# Patient Record
Sex: Female | Born: 1980 | Race: Black or African American | Hispanic: No | Marital: Single | Smoking: Never smoker
Health system: Southern US, Community
[De-identification: ages and names within clinical notes are randomized; demographics above are authoritative.]

## PROBLEM LIST (undated history)

## (undated) DIAGNOSIS — E119 Type 2 diabetes mellitus without complications: Secondary | ICD-10-CM

## (undated) DIAGNOSIS — Z789 Other specified health status: Secondary | ICD-10-CM

---

## 1999-05-11 ENCOUNTER — Emergency Department (HOSPITAL_COMMUNITY): Admission: EM | Admit: 1999-05-11 | Discharge: 1999-05-11 | Payer: Self-pay | Admitting: *Deleted

## 2000-12-24 ENCOUNTER — Emergency Department (HOSPITAL_COMMUNITY): Admission: EM | Admit: 2000-12-24 | Discharge: 2000-12-24 | Payer: Self-pay

## 2002-12-07 ENCOUNTER — Emergency Department (HOSPITAL_COMMUNITY): Admission: EM | Admit: 2002-12-07 | Discharge: 2002-12-07 | Payer: Self-pay | Admitting: Emergency Medicine

## 2010-04-21 HISTORY — PX: ELBOW SURGERY: SHX618

## 2014-01-12 ENCOUNTER — Encounter: Payer: Self-pay | Admitting: Certified Nurse Midwife

## 2015-12-21 ENCOUNTER — Other Ambulatory Visit: Payer: Self-pay | Admitting: Orthopedic Surgery

## 2015-12-21 ENCOUNTER — Ambulatory Visit
Admission: RE | Admit: 2015-12-21 | Discharge: 2015-12-21 | Disposition: A | Discharge status: Home / Self Care | Payer: 59 | Source: Ambulatory Visit | Attending: Orthopedic Surgery | Admitting: Orthopedic Surgery

## 2015-12-21 DIAGNOSIS — M47812 Spondylosis without myelopathy or radiculopathy, cervical region: Secondary | ICD-10-CM

## 2017-12-30 ENCOUNTER — Ambulatory Visit: Payer: Self-pay | Admitting: Allergy

## 2018-02-11 ENCOUNTER — Ambulatory Visit: Payer: Self-pay | Admitting: Plastic Surgery

## 2018-02-16 ENCOUNTER — Encounter (HOSPITAL_BASED_OUTPATIENT_CLINIC_OR_DEPARTMENT_OTHER): Payer: Self-pay | Admitting: *Deleted

## 2018-02-16 ENCOUNTER — Other Ambulatory Visit: Payer: Self-pay

## 2018-02-23 ENCOUNTER — Encounter (HOSPITAL_BASED_OUTPATIENT_CLINIC_OR_DEPARTMENT_OTHER)
Admission: RE | Disposition: A | Discharge status: Home / Self Care | Payer: Self-pay | Source: Ambulatory Visit | Attending: Plastic Surgery

## 2018-02-23 ENCOUNTER — Ambulatory Visit (HOSPITAL_BASED_OUTPATIENT_CLINIC_OR_DEPARTMENT_OTHER): Payer: 59 | Admitting: Certified Registered"

## 2018-02-23 ENCOUNTER — Ambulatory Visit (HOSPITAL_BASED_OUTPATIENT_CLINIC_OR_DEPARTMENT_OTHER)
Admission: RE | Admit: 2018-02-23 | Discharge: 2018-02-23 | Disposition: A | Discharge status: Home / Self Care | Payer: 59 | Source: Ambulatory Visit | Attending: Plastic Surgery | Admitting: Plastic Surgery

## 2018-02-23 ENCOUNTER — Other Ambulatory Visit: Payer: Self-pay

## 2018-02-23 ENCOUNTER — Encounter (HOSPITAL_BASED_OUTPATIENT_CLINIC_OR_DEPARTMENT_OTHER): Payer: Self-pay | Admitting: Certified Registered"

## 2018-02-23 DIAGNOSIS — N62 Hypertrophy of breast: Secondary | ICD-10-CM | POA: Insufficient documentation

## 2018-02-23 DIAGNOSIS — Z6841 Body Mass Index (BMI) 40.0 and over, adult: Secondary | ICD-10-CM | POA: Diagnosis not present

## 2018-02-23 HISTORY — DX: Other specified health status: Z78.9

## 2018-02-23 HISTORY — PX: BREAST REDUCTION SURGERY: SHX8

## 2018-02-23 SURGERY — MAMMOPLASTY, REDUCTION
Anesthesia: General | Site: Breast | Laterality: Bilateral

## 2018-02-23 MED ORDER — ROCURONIUM BROMIDE 100 MG/10ML IV SOLN
INTRAVENOUS | Status: DC | PRN
  Administered 2018-02-23: 50 mg via INTRAVENOUS
  Administered 2018-02-23: 20 mg via INTRAVENOUS

## 2018-02-23 MED ORDER — OXYCODONE HCL 5 MG PO TABS
5.0000 mg | ORAL_TABLET | Freq: Once | ORAL | Status: AC | PRN
  Administered 2018-02-23: 5 mg via ORAL

## 2018-02-23 MED ORDER — LACTATED RINGERS IV SOLN
INTRAVENOUS | Status: DC
  Administered 2018-02-23 (×4): via INTRAVENOUS

## 2018-02-23 MED ORDER — ACETAMINOPHEN 325 MG PO TABS
325.0000 mg | ORAL_TABLET | ORAL | Status: DC | PRN
  Administered 2018-02-23: 650 mg via ORAL

## 2018-02-23 MED ORDER — ACETAMINOPHEN 160 MG/5ML PO SOLN: 325.0000 mg | ORAL | Status: DC | PRN

## 2018-02-23 MED ORDER — PROPOFOL 10 MG/ML IV BOLUS
INTRAVENOUS | Status: AC
  Filled 2018-02-23: qty 20

## 2018-02-23 MED ORDER — SUGAMMADEX SODIUM 500 MG/5ML IV SOLN
INTRAVENOUS | Status: AC
  Filled 2018-02-23: qty 5

## 2018-02-23 MED ORDER — ONDANSETRON HCL 4 MG/2ML IJ SOLN
INTRAMUSCULAR | Status: DC | PRN
  Administered 2018-02-23: 4 mg via INTRAVENOUS

## 2018-02-23 MED ORDER — CEFAZOLIN SODIUM-DEXTROSE 2-4 GM/100ML-% IV SOLN
INTRAVENOUS | Status: AC
  Filled 2018-02-23: qty 100

## 2018-02-23 MED ORDER — FENTANYL CITRATE (PF) 100 MCG/2ML IJ SOLN
INTRAMUSCULAR | Status: AC
  Filled 2018-02-23: qty 2

## 2018-02-23 MED ORDER — LIDOCAINE 2% (20 MG/ML) 5 ML SYRINGE
INTRAMUSCULAR | Status: AC
  Filled 2018-02-23: qty 10

## 2018-02-23 MED ORDER — ONDANSETRON HCL 4 MG/2ML IJ SOLN
INTRAMUSCULAR | Status: AC
  Filled 2018-02-23: qty 4

## 2018-02-23 MED ORDER — SODIUM CHLORIDE 0.9 % IJ SOLN
INTRAMUSCULAR | Status: AC
  Filled 2018-02-23: qty 50

## 2018-02-23 MED ORDER — SCOPOLAMINE 1 MG/3DAYS TD PT72: 1.0000 | MEDICATED_PATCH | Freq: Once | TRANSDERMAL | Status: DC | PRN

## 2018-02-23 MED ORDER — LIDOCAINE HCL (CARDIAC) PF 100 MG/5ML IV SOSY
PREFILLED_SYRINGE | INTRAVENOUS | Status: DC | PRN
  Administered 2018-02-23: 60 mg via INTRAVENOUS

## 2018-02-23 MED ORDER — SODIUM CHLORIDE 0.9 % IJ SOLN
INTRAMUSCULAR | Status: DC | PRN
  Administered 2018-02-23: 60 mL

## 2018-02-23 MED ORDER — KETOROLAC TROMETHAMINE 30 MG/ML IJ SOLN
30.0000 mg | Freq: Once | INTRAMUSCULAR | Status: AC | PRN
  Administered 2018-02-23: 30 mg via INTRAVENOUS

## 2018-02-23 MED ORDER — BUPIVACAINE LIPOSOME 1.3 % IJ SUSP
INTRAMUSCULAR | Status: DC | PRN
  Administered 2018-02-23: 50 mL

## 2018-02-23 MED ORDER — PROMETHAZINE HCL 25 MG/ML IJ SOLN: 6.2500 mg | INTRAMUSCULAR | Status: DC | PRN

## 2018-02-23 MED ORDER — SUGAMMADEX SODIUM 500 MG/5ML IV SOLN
INTRAVENOUS | Status: DC | PRN
  Administered 2018-02-23: 200 mg via INTRAVENOUS

## 2018-02-23 MED ORDER — SODIUM CHLORIDE 0.9 % IJ SOLN
INTRAMUSCULAR | Status: DC | PRN
  Administered 2018-02-23: 70 mL

## 2018-02-23 MED ORDER — PROPOFOL 10 MG/ML IV BOLUS
INTRAVENOUS | Status: DC | PRN
  Administered 2018-02-23: 200 mg via INTRAVENOUS

## 2018-02-23 MED ORDER — CEFAZOLIN SODIUM-DEXTROSE 2-4 GM/100ML-% IV SOLN
2.0000 g | INTRAVENOUS | Status: AC
  Administered 2018-02-23: 2 g via INTRAVENOUS

## 2018-02-23 MED ORDER — SODIUM CHLORIDE 0.9 % IJ SOLN
INTRAMUSCULAR | Status: AC
  Filled 2018-02-23: qty 20

## 2018-02-23 MED ORDER — MIDAZOLAM HCL 2 MG/2ML IJ SOLN
INTRAMUSCULAR | Status: AC
  Filled 2018-02-23: qty 2

## 2018-02-23 MED ORDER — LIDOCAINE-EPINEPHRINE 1 %-1:100000 IJ SOLN
INTRAMUSCULAR | Status: AC
  Filled 2018-02-23: qty 1

## 2018-02-23 MED ORDER — FENTANYL CITRATE (PF) 100 MCG/2ML IJ SOLN
25.0000 ug | INTRAMUSCULAR | Status: DC | PRN
  Administered 2018-02-23: 50 ug via INTRAVENOUS
  Administered 2018-02-23 (×2): 25 ug via INTRAVENOUS

## 2018-02-23 MED ORDER — ACETAMINOPHEN 325 MG PO TABS
ORAL_TABLET | ORAL | Status: AC
  Filled 2018-02-23: qty 2

## 2018-02-23 MED ORDER — OXYCODONE HCL 5 MG PO TABS
ORAL_TABLET | ORAL | Status: AC
  Filled 2018-02-23: qty 1

## 2018-02-23 MED ORDER — DEXAMETHASONE SODIUM PHOSPHATE 4 MG/ML IJ SOLN
INTRAMUSCULAR | Status: DC | PRN
  Administered 2018-02-23: 10 mg via INTRAVENOUS

## 2018-02-23 MED ORDER — KETOROLAC TROMETHAMINE 30 MG/ML IJ SOLN
INTRAMUSCULAR | Status: AC
  Filled 2018-02-23: qty 1

## 2018-02-23 MED ORDER — PROPOFOL 500 MG/50ML IV EMUL
INTRAVENOUS | Status: AC
  Filled 2018-02-23: qty 100

## 2018-02-23 MED ORDER — BUPIVACAINE HCL (PF) 0.5 % IJ SOLN
INTRAMUSCULAR | Status: AC
  Filled 2018-02-23: qty 30

## 2018-02-23 MED ORDER — BUPIVACAINE HCL (PF) 0.25 % IJ SOLN
INTRAMUSCULAR | Status: AC
  Filled 2018-02-23: qty 30

## 2018-02-23 MED ORDER — CHLORHEXIDINE GLUCONATE CLOTH 2 % EX PADS: 6.0000 | MEDICATED_PAD | Freq: Once | CUTANEOUS | Status: DC

## 2018-02-23 MED ORDER — MEPERIDINE HCL 25 MG/ML IJ SOLN: 6.2500 mg | INTRAMUSCULAR | Status: DC | PRN

## 2018-02-23 MED ORDER — DEXAMETHASONE SODIUM PHOSPHATE 10 MG/ML IJ SOLN
INTRAMUSCULAR | Status: AC
  Filled 2018-02-23: qty 1

## 2018-02-23 MED ORDER — BACITRACIN ZINC 500 UNIT/GM EX OINT
TOPICAL_OINTMENT | CUTANEOUS | Status: AC
  Filled 2018-02-23: qty 28.35

## 2018-02-23 MED ORDER — OXYCODONE HCL 5 MG/5ML PO SOLN: 5.0000 mg | Freq: Once | ORAL | Status: AC | PRN

## 2018-02-23 MED ORDER — FENTANYL CITRATE (PF) 100 MCG/2ML IJ SOLN
50.0000 ug | INTRAMUSCULAR | Status: AC | PRN
  Administered 2018-02-23: 100 ug via INTRAVENOUS
  Administered 2018-02-23 (×4): 50 ug via INTRAVENOUS
  Administered 2018-02-23: 100 ug via INTRAVENOUS

## 2018-02-23 MED ORDER — PROPOFOL 500 MG/50ML IV EMUL
INTRAVENOUS | Status: DC | PRN
  Administered 2018-02-23: 35 ug/kg/min via INTRAVENOUS

## 2018-02-23 MED ORDER — BACITRACIN ZINC 500 UNIT/GM EX OINT
TOPICAL_OINTMENT | CUTANEOUS | Status: DC | PRN
  Administered 2018-02-23: 1 via TOPICAL

## 2018-02-23 MED ORDER — MIDAZOLAM HCL 2 MG/2ML IJ SOLN
1.0000 mg | INTRAMUSCULAR | Status: DC | PRN
  Administered 2018-02-23: 2 mg via INTRAVENOUS

## 2018-02-23 MED ORDER — BUPIVACAINE-EPINEPHRINE (PF) 0.25% -1:200000 IJ SOLN
INTRAMUSCULAR | Status: AC
  Filled 2018-02-23: qty 30

## 2018-02-23 MED ORDER — EPHEDRINE SULFATE 50 MG/ML IJ SOLN
INTRAMUSCULAR | Status: DC | PRN
  Administered 2018-02-23: 10 mg via INTRAVENOUS

## 2018-02-23 MED ORDER — ROCURONIUM BROMIDE 50 MG/5ML IV SOSY
PREFILLED_SYRINGE | INTRAVENOUS | Status: AC
  Filled 2018-02-23: qty 5

## 2018-02-23 MED ORDER — BUPIVACAINE LIPOSOME 1.3 % IJ SUSP
INTRAMUSCULAR | Status: AC
  Filled 2018-02-23: qty 20

## 2018-02-23 SURGICAL SUPPLY — 57 items
BAG DECANTER FOR FLEXI CONT (MISCELLANEOUS) ×3 IMPLANT
BENZOIN TINCTURE PRP APPL 2/3 (GAUZE/BANDAGES/DRESSINGS) ×6 IMPLANT
BLADE KNIFE PERSONA 10 (BLADE) ×12 IMPLANT
BLADE KNIFE PERSONA 15 (BLADE) ×9 IMPLANT
BNDG GAUZE ELAST 4 BULKY (GAUZE/BANDAGES/DRESSINGS) ×6 IMPLANT
CANISTER SUCT 1200ML W/VALVE (MISCELLANEOUS) ×3 IMPLANT
CLOSURE WOUND 1/2 X4 (GAUZE/BANDAGES/DRESSINGS) ×4
COVER BACK TABLE 60X90IN (DRAPES) ×3 IMPLANT
COVER MAYO STAND STRL (DRAPES) ×3 IMPLANT
DECANTER SPIKE VIAL GLASS SM (MISCELLANEOUS) ×6 IMPLANT
DRAIN CHANNEL 10F 3/8 F FF (DRAIN) ×6 IMPLANT
DRAPE U-SHAPE 76X120 STRL (DRAPES) ×4 IMPLANT
DRSG EMULSION OIL 3X3 NADH (GAUZE/BANDAGES/DRESSINGS) ×6 IMPLANT
DRSG PAD ABDOMINAL 8X10 ST (GAUZE/BANDAGES/DRESSINGS) ×6 IMPLANT
ELECT REM PT RETURN 9FT ADLT (ELECTROSURGICAL) ×3
ELECTRODE REM PT RTRN 9FT ADLT (ELECTROSURGICAL) ×1 IMPLANT
EVACUATOR SILICONE 100CC (DRAIN) ×6 IMPLANT
GAUZE SPONGE 4X4 12PLY STRL (GAUZE/BANDAGES/DRESSINGS) ×6 IMPLANT
GLOVE BIO SURGEON STRL SZ 6.5 (GLOVE) ×1 IMPLANT
GLOVE BIO SURGEON STRL SZ7 (GLOVE) ×3 IMPLANT
GLOVE BIO SURGEONS STRL SZ 6.5 (GLOVE) ×1
GLOVE BIOGEL PI IND STRL 7.0 (GLOVE) IMPLANT
GLOVE BIOGEL PI INDICATOR 7.0 (GLOVE) ×2
GOWN STRL REUS W/ TWL LRG LVL3 (GOWN DISPOSABLE) ×2 IMPLANT
GOWN STRL REUS W/TWL LRG LVL3 (GOWN DISPOSABLE) ×4
NDL HYPO 25X1 1.5 SAFETY (NEEDLE) ×3 IMPLANT
NDL SAFETY ECLIPSE 18X1.5 (NEEDLE) ×1 IMPLANT
NDL SPNL 18GX3.5 QUINCKE PK (NEEDLE) ×1 IMPLANT
NEEDLE HYPO 18GX1.5 SHARP (NEEDLE) ×2
NEEDLE HYPO 25X1 1.5 SAFETY (NEEDLE) ×3 IMPLANT
NEEDLE SPNL 18GX3.5 QUINCKE PK (NEEDLE) ×3 IMPLANT
NS IRRIG 1000ML POUR BTL (IV SOLUTION) ×6 IMPLANT
PACK BASIN DAY SURGERY FS (CUSTOM PROCEDURE TRAY) ×3 IMPLANT
PIN SAFETY STERILE (MISCELLANEOUS) ×3 IMPLANT
SCRUB TECHNI CARE 4 OZ NO DYE (MISCELLANEOUS) ×3 IMPLANT
SLEEVE SCD COMPRESS KNEE MED (MISCELLANEOUS) ×3 IMPLANT
SPONGE LAP 18X18 RF (DISPOSABLE) ×11 IMPLANT
STAPLER VISISTAT 35W (STAPLE) ×3 IMPLANT
STRIP CLOSURE SKIN 1/2X4 (GAUZE/BANDAGES/DRESSINGS) ×8 IMPLANT
SUT ETHILON 3 0 PS 1 (SUTURE) ×3 IMPLANT
SUT MNCRL AB 3-0 PS2 18 (SUTURE) ×12 IMPLANT
SUT MNCRL AB 4-0 PS2 18 (SUTURE) ×6 IMPLANT
SUT MON AB 5-0 PS2 18 (SUTURE) ×6 IMPLANT
SUT PROLENE 2 0 CT2 30 (SUTURE) ×3 IMPLANT
SUT PROLENE 3 0 PS 1 (SUTURE) ×6 IMPLANT
SUT VLOC 90 P-14 23 (SUTURE) ×6 IMPLANT
SYR BULB IRRIGATION 50ML (SYRINGE) ×6 IMPLANT
SYR CONTROL 10ML LL (SYRINGE) ×6 IMPLANT
TAPE MEASURE VINYL STERILE (MISCELLANEOUS) ×3 IMPLANT
TOWEL GREEN STERILE FF (TOWEL DISPOSABLE) ×9 IMPLANT
TRAY DSU PREP LF (CUSTOM PROCEDURE TRAY) ×3 IMPLANT
TRAY FOLEY W/BAG SLVR 14FR LF (SET/KITS/TRAYS/PACK) ×2 IMPLANT
TUBE CONNECTING 20'X1/4 (TUBING) ×1
TUBE CONNECTING 20X1/4 (TUBING) ×2 IMPLANT
UNDERPAD 30X30 (UNDERPADS AND DIAPERS) ×6 IMPLANT
VAC PENCILS W/TUBING CLEAR (MISCELLANEOUS) ×3 IMPLANT
YANKAUER SUCT BULB TIP NO VENT (SUCTIONS) ×3 IMPLANT

## 2018-02-23 NOTE — Discharge Instructions (Signed)
1. No lifting greater than 5 lbs with arms for 4 weeks. 2. Empty, strip, record and reactivate JP drains 3 times a day. 3. Percocet 5/325 mg tabs 1-2 tabs po q 4-6 hours prn pain- prescription given in office. 4. Duricef 1 tab po bid- prescription given in office. 5. Sterapred dose pack as directed- prescription given in office. 6. Follow-up appointment Friday in office.     About my Jackson-Pratt Bulb Drain  What is a Jackson-Pratt bulb? A Jackson-Pratt is a soft, round device used to collect drainage. It is connected to a long, thin drainage catheter, which is held in place by one or two small stiches near your surgical incision site. When the bulb is squeezed, it forms a vacuum, forcing the drainage to empty into the bulb.  Emptying the Jackson-Pratt bulb- To empty the bulb: 1. Release the plug on the top of the bulb. 2. Pour the bulb's contents into a measuring container which your nurse will provide. 3. Record the time emptied and amount of drainage. Empty the drain(s) as often as your     doctor or nurse recommends.  Date                  Time                    Amount (Drain 1)                 Amount (Drain 2)  _____________________________________________________________________  _____________________________________________________________________  _____________________________________________________________________  _____________________________________________________________________  _____________________________________________________________________  _____________________________________________________________________  _____________________________________________________________________  _____________________________________________________________________  Squeezing the Jackson-Pratt Bulb- To squeeze the bulb: 1. Make sure the plug at the top of the bulb is open. 2. Squeeze the bulb tightly in your fist. You will hear air squeezing from the bulb. 3.  Replace the plug while the bulb is squeezed. 4. Use a safety pin to attach the bulb to your clothing. This will keep the catheter from     pulling at the bulb insertion site.  When to call your doctor- Call your doctor if:  Drain site becomes red, swollen or hot.  You have a fever greater than 101 degrees F.  There is oozing at the drain site.  Drain falls out (apply a guaze bandage over the drain hole and secure it with tape).  Drainage increases daily not related to activity patterns. (You will usually have more drainage when you are active than when you are resting.)  Drainage has a bad odor.      Post Anesthesia Home Care Instructions  Activity: Get plenty of rest for the remainder of the day. A responsible individual must stay with you for 24 hours following the procedure.  For the next 24 hours, DO NOT: -Drive a car -Advertising copywriter -Drink alcoholic beverages -Take any medication unless instructed by your physician -Make any legal decisions or sign important papers.  Meals: Start with liquid foods such as gelatin or soup. Progress to regular foods as tolerated. Avoid greasy, spicy, heavy foods. If nausea and/or vomiting occur, drink only clear liquids until the nausea and/or vomiting subsides. Call your physician if vomiting continues.  Special Instructions/Symptoms: Your throat may feel dry or sore from the anesthesia or the breathing tube placed in your throat during surgery. If this causes discomfort, gargle with warm salt water. The discomfort should disappear within 24 hours.  If you had a scopolamine patch placed behind your ear for the management of post- operative nausea and/or vomiting:  1. The  medication in the patch is effective for 72 hours, after which it should be removed.  Wrap patch in a tissue and discard in the trash. Wash hands thoroughly with soap and water. 2. You may remove the patch earlier than 72 hours if you experience unpleasant side  effects which may include dry mouth, dizziness or visual disturbances. 3. Avoid touching the patch. Wash your hands with soap and water after contact with the patch.     Information for Discharge Teaching: EXPAREL (bupivacaine liposome injectable suspension)   Your surgeon or anesthesiologist gave you EXPAREL(bupivacaine) to help control your pain after surgery.   EXPAREL is a local anesthetic that provides pain relief by numbing the tissue around the surgical site.  EXPAREL is designed to release pain medication over time and can control pain for up to 72 hours.  Depending on how you respond to EXPAREL, you may require less pain medication during your recovery.  Possible side effects:  Temporary loss of sensation or ability to move in the area where bupivacaine was injected.  Nausea, vomiting, constipation  Rarely, numbness and tingling in your mouth or lips, lightheadedness, or anxiety may occur.  Call your doctor right away if you think you may be experiencing any of these sensations, or if you have other questions regarding possible side effects.  Follow all other discharge instructions given to you by your surgeon or nurse. Eat a healthy diet and drink plenty of water or other fluids.  If you return to the hospital for any reason within 96 hours following the administration of EXPAREL, it is important for health care providers to know that you have received this anesthetic. A teal colored band has been placed on your arm with the date, time and amount of EXPAREL you have received in order to alert and inform your health care providers. Please leave this armband in place for the full 96 hours following administration, and then you may remove the band.

## 2018-02-23 NOTE — Anesthesia Preprocedure Evaluation (Addendum)
Anesthesia Evaluation  Patient identified by MRN, date of birth, ID band Patient awake    Reviewed: Allergy & Precautions, NPO status , Patient's Chart, lab work & pertinent test results  Airway Mallampati: I       Dental no notable dental hx. (+) Teeth Intact   Pulmonary neg pulmonary ROS,    Pulmonary exam normal breath sounds clear to auscultation       Cardiovascular negative cardio ROS Normal cardiovascular exam Rhythm:Regular Rate:Normal     Neuro/Psych negative neurological ROS  negative psych ROS   GI/Hepatic negative GI ROS, Neg liver ROS,   Endo/Other  negative endocrine ROSMorbid obesity  Renal/GU negative Renal ROS  negative genitourinary   Musculoskeletal negative musculoskeletal ROS (+)   Abdominal (+) + obese,   Peds  Hematology negative hematology ROS (+)   Anesthesia Other Findings Day of surgery medications reviewed with the patient.  Reproductive/Obstetrics                            Anesthesia Physical Anesthesia Plan  ASA: III  Anesthesia Plan: General   Post-op Pain Management:    Induction:   PONV Risk Score and Plan: 3 and Treatment may vary due to age or medical condition, Ondansetron, Dexamethasone, Midazolam and Scopolamine patch - Pre-op  Airway Management Planned: Oral ETT  Additional Equipment:   Intra-op Plan:   Post-operative Plan: Extubation in OR  Informed Consent: I have reviewed the patients History and Physical, chart, labs and discussed the procedure including the risks, benefits and alternatives for the proposed anesthesia with the patient or authorized representative who has indicated his/her understanding and acceptance.   Dental advisory given  Plan Discussed with:   Anesthesia Plan Comments:        Anesthesia Quick Evaluation

## 2018-02-23 NOTE — Op Note (Signed)
OPERATIVE REPORT  02/23/2018  Leslie West  PREOPERATIVE DIAGNOSIS:  Bilateral macromastia.  POSTOPERATIVE DIAGNOSIS:  Bilateral macromastia.  PROCEDURE:  Bilateral reduction mammoplasties.  ATTENDING SURGEON:  Eloise Levels, MD  ANESTHESIA:  General.  ANESTHESIOLOGIST: Leilani Able , MD  COMPLICATIONS:  None.  INDICATIONS FOR THE PROCEDURE:  The patient is a 38 y.o. female who has bilateral macromastia that is clinically symptomatic.  She presents to undergo bilateral reduction mammoplasties.  DESCRIPTION OF PROCEDURE:  The patient was marked in preop holding area in a pattern of Wise for the future bilateral reduction mammoplasties. She was then taken back to the OR, placed on the table in supine position.  After adequate general anesthesia was obtained, the patient's chest was prepped with Techni-Care and draped in sterile fashion.  The bases of the breasts have been infiltrated with 1% lidocaine with epinephrine.  After adequate hemostasis and anesthesia taken effect, the procedure was begun.  Both of the breast reductions were performed in the following similar manner.  The nipple-areolar complex was marked with a 45-mm nipple marker.  The skin was then incised and deepithelialized around the nipple-areolar complex down to the inframammary crease in the inferior pedicle pattern.  Next, the medial, superior, and lateral skin flaps were elevated down to the chest wall.  Excess fat and glandular tissue removed from the inferior pedicle.  The nipple-areolar complex was examined and found to be pink and viable.  The wound was irrigated with saline irrigation.  Meticulous hemostasis was obtained with the Bovie electrocautery.  Inferior pedicle was centralized using 3-0 Prolene suture.  A #10 JP flat fully fluted drain was placed into the wound. The skin flaps were brought together at the inverted T junction with a 2- 0 Prolene suture.  The incisions were  stapled for temporary closure. The breasts compared and found to have good shape and symmetry.  The incisions were then closed from the medial aspect of the JP drain to the medial aspect of the [facility] incision by first placing a few 3-0 Monocryl sutures to tack together the dermal layer, and then both the dermal and cuticular layer were closed in a single layer using a 2-0 V-Lock barbed suture.  Lateral to the JP drain incision was closed using 3-0 Monocryl in the dermal layer, followed by 3-0 Monocryl running intracuticular stitch on the skin.  The vertical limb of the Wise pattern was closed in the dermal layer using 3-0 Monocryl suture.  The patient was placed in the upright position.  The future location of the nipple-areolar complexes was marked on both breast mounds using the 45-mm nipple marker.  She was then placed back in the recumbent position.  Both of the nipple areolar complexes were brought out onto the breast mounds in the following similar manner.  The skin was incised as marked and removed in full thickness into the subcutaneous tissues.  The nipple- areolar complex was examined, found to be pink and viable, then brought out through this aperture and sewn in place using 4-0 Monocryl in the dermal layer, followed by 5-0 Monocryl running intracuticular stitch on the skin.  This 5-0 Monocryl suture was then brought down to close the cuticular layer of the vertical limb as well.  The JP drain was sewn in place using 3-0 nylon suture.  The pectoralis major muscle and fascia along with the breast and chest soft tissues were then infiltrated with 1% Exparel (total 266 mg).  Now the [facility] incision was also infiltrated with  the Exparel in order to give the patient postoperative pain control.  The incisions were dressed with benzoin, Steri-Strips, and the nipples dressed with bacitracin ointment and Adaptic.  4x4s were placed over the incisions and ABD pads in the axillary areas.  The  patient was placed into a light postoperative support bra.  There were no complications. The patient tolerated the procedure well.  The final needle, sponge counts were reported to be correct at the end of the case.  The patient was then recovered without complications.  Both the patient and her family were given proper postoperative wound care instructions. She was then discharged home in the care of her family in stable condition.  Follow up will be with me in a few days in the office.         Eloise Levels, M.D.  02/23/2018 1:03 PM

## 2018-02-23 NOTE — Brief Op Note (Signed)
02/23/2018  1:01 PM  PATIENT:  Leslie West  37 y.o. female  PRE-OPERATIVE DIAGNOSIS:  BILATERAL MACROMASTIA  POST-OPERATIVE DIAGNOSIS:  BILATERAL MACROMASTIA  PROCEDURE:  Procedure(s): MAMMARY REDUCTION  (BREAST) (Bilateral)  SURGEON:  Surgeon(s) and Role:    * Contogiannis, Chales Abrahams, MD - Primary  ANESTHESIA:   general  EBL:  100 mL   BLOOD ADMINISTERED:none  DRAINS: (37F) Jackson-Pratt drain(s) with closed bulb suction in the Bilateral Breasts   LOCAL MEDICATIONS USED:  1.3% Exparel (total 266 mgs.)  SPECIMEN:  Source of Specimen:  Bilateral Breasts  DISPOSITION OF SPECIMEN:  PATHOLOGY  COUNTS:  YES  DICTATION: .Note written in EPIC  PLAN OF CARE: Discharge to home after PACU  PATIENT DISPOSITION:  PACU - hemodynamically stable.   Delay start of Pharmacological VTE agent (>24hrs) due to surgical blood loss or risk of bleeding: not applicable

## 2018-02-23 NOTE — H&P (Signed)
H&P faxed to surgical center.  -History and Physical Reviewed  -Patient has been re-examined  -No change in the plan of care  , A   H&P faxed to surgical center.  -History and Physical Reviewed  -Patient has been re-examined  -No change in the plan of care  , A

## 2018-02-23 NOTE — Anesthesia Procedure Notes (Signed)
Procedure Name: Intubation Date/Time: 02/23/2018 7:55 AM Performed by: Signe Colt, CRNA Pre-anesthesia Checklist: Patient identified, Emergency Drugs available, Suction available and Patient being monitored Patient Re-evaluated:Patient Re-evaluated prior to induction Oxygen Delivery Method: Circle system utilized Preoxygenation: Pre-oxygenation with 100% oxygen Induction Type: IV induction Ventilation: Mask ventilation without difficulty Laryngoscope Size: Mac and 3 Grade View: Grade II Tube type: Oral Tube size: 7.0 mm Number of attempts: 1 Airway Equipment and Method: Stylet and Oral airway Placement Confirmation: ETT inserted through vocal cords under direct vision,  positive ETCO2 and breath sounds checked- equal and bilateral Secured at: 21 cm Tube secured with: Tape Dental Injury: Teeth and Oropharynx as per pre-operative assessment

## 2018-02-23 NOTE — Anesthesia Postprocedure Evaluation (Signed)
Anesthesia Post Note  Patient: Leslie West  Procedure(s) Performed: MAMMARY REDUCTION  (BREAST) (Bilateral Breast)     Patient location during evaluation: PACU Anesthesia Type: General Level of consciousness: awake and alert Pain management: pain level controlled Vital Signs Assessment: post-procedure vital signs reviewed and stable Respiratory status: spontaneous breathing, nonlabored ventilation and respiratory function stable Cardiovascular status: blood pressure returned to baseline and stable Postop Assessment: no apparent nausea or vomiting Anesthetic complications: no Comments: HR 110s, consistent with preoperative HR.     Last Vitals:  Vitals:   02/23/18 1400 02/23/18 1415  BP: (!) 143/70 120/66  Pulse: (!) 116 (!) 118  Resp: (!) 24 (!) 24  Temp:    SpO2: 100% 98%    Last Pain:  Vitals:   02/23/18 1400  TempSrc:   PainSc: 5                  Kaylyn Layer

## 2018-02-23 NOTE — Transfer of Care (Signed)
Immediate Anesthesia Transfer of Care Note  Patient: Leslie West  Procedure(s) Performed: MAMMARY REDUCTION  (BREAST) (Bilateral Breast)  Patient Location: PACU  Anesthesia Type:General  Level of Consciousness: drowsy and patient cooperative  Airway & Oxygen Therapy: Patient Spontanous Breathing and Patient connected to face mask oxygen  Post-op Assessment: Report given to RN and Post -op Vital signs reviewed and stable  Post vital signs: Reviewed and stable  Last Vitals:  Vitals Value Taken Time  BP 140/70 02/23/2018  1:22 PM  Temp    Pulse 127 02/23/2018  1:25 PM  Resp 25 02/23/2018  1:25 PM  SpO2 100 % 02/23/2018  1:25 PM  Vitals shown include unvalidated device data.  Last Pain:  Vitals:   02/23/18 0654  TempSrc: Oral  PainSc: 0-No pain      Patients Stated Pain Goal: 4 (02/23/18 0654)  Complications: No apparent anesthesia complications

## 2018-02-24 ENCOUNTER — Encounter (HOSPITAL_BASED_OUTPATIENT_CLINIC_OR_DEPARTMENT_OTHER): Payer: Self-pay | Admitting: Plastic Surgery

## 2018-07-05 ENCOUNTER — Emergency Department (HOSPITAL_COMMUNITY): Payer: 59

## 2018-07-05 ENCOUNTER — Emergency Department (HOSPITAL_COMMUNITY)
Admission: EM | Admit: 2018-07-05 | Discharge: 2018-07-05 | Disposition: A | Discharge status: Home / Self Care | Payer: 59 | Attending: Emergency Medicine | Admitting: Emergency Medicine

## 2018-07-05 ENCOUNTER — Other Ambulatory Visit: Payer: Self-pay

## 2018-07-05 ENCOUNTER — Encounter (HOSPITAL_COMMUNITY): Payer: Self-pay | Admitting: Emergency Medicine

## 2018-07-05 DIAGNOSIS — R030 Elevated blood-pressure reading, without diagnosis of hypertension: Secondary | ICD-10-CM | POA: Diagnosis not present

## 2018-07-05 DIAGNOSIS — J029 Acute pharyngitis, unspecified: Secondary | ICD-10-CM | POA: Diagnosis present

## 2018-07-05 DIAGNOSIS — Z79899 Other long term (current) drug therapy: Secondary | ICD-10-CM | POA: Diagnosis not present

## 2018-07-05 DIAGNOSIS — J00 Acute nasopharyngitis [common cold]: Secondary | ICD-10-CM

## 2018-07-05 LAB — POC URINE PREG, ED: PREG TEST UR: NEGATIVE

## 2018-07-05 MED ORDER — BENZONATATE 100 MG PO CAPS: 200.0000 mg | ORAL_CAPSULE | Freq: Three times a day (TID) | ORAL | 0 refills | Status: DC | PRN

## 2018-07-05 MED ORDER — HYDROCOD POLST-CPM POLST ER 10-8 MG/5ML PO SUER: 5.0000 mL | Freq: Two times a day (BID) | ORAL | 0 refills | Status: DC

## 2018-07-05 MED ORDER — BENZONATATE 100 MG PO CAPS
200.0000 mg | ORAL_CAPSULE | Freq: Once | ORAL | Status: AC
  Administered 2018-07-05: 200 mg via ORAL
  Filled 2018-07-05: qty 2

## 2018-07-05 NOTE — Discharge Instructions (Addendum)
Rest to make sure you are drinking plenty of fluids.  Rest your voice so this can heal as well.  Continue taking your home medications, you may add the Tessalon for additional cough relief.  This will not make you drowsy.  Also Tussionex is a narcotic cough syrup which can also greatly improve your cough and should help you sleep as this medication will make you drowsy.  Having said that, do not drive within 4 hours of taking this medication.  Get rechecked for any worsened or persistent symptoms.  Your chest x-ray is clear today.  Also, your blood pressure has been elevated during todays exam and you will benefit from establishing care with a primary doctor for further evaluation and management of this- several suggestions for this are listed.

## 2018-07-05 NOTE — ED Notes (Signed)
poc preg resulted negative

## 2018-07-05 NOTE — ED Triage Notes (Signed)
Patient reports chills on Tuesday night but none since, has had a sore throat. Patient reports cough that is worse at night.

## 2018-07-05 NOTE — ED Provider Notes (Signed)
[facility] EMERGENCY DEPARTMENT Provider Note   CSN: [id number] Arrival date & time: 07/05/18  1656    History   Chief Complaint Chief Complaint  Patient presents with  . Sore Throat    HPI Leslie West is a 38 y.o. female presented with a 2-week history of URI type symptoms which started as nasal congestion with yellow thick nasal discharge along with copious postnasal drip.  Her congestion symptoms have improved but she has developed sore throat along with hoarseness of voice along with a dry tickle in her throat causing significant nonproductive cough and choking to the point of near emesis.  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  She was unable to perform her job secondary to frequency of coughing and gagging.  She has used Equate brand OTC medications including an equate brand of allergy decongestant along with Flonase nasal spray.  She endorses having worsened allergies since returning here from living in [place].  She denies fevers or chills, although states she may have had fever when her symptoms first started.  She denies nausea or abdominal pain.  Also denies headache, ear pain, dizziness.     The history is provided by the patient.    Past Medical History:  Diagnosis Date  . Medical history non-contributory     There are no active problems to display for this patient.   Past Surgical History:  Procedure Laterality Date  . BREAST REDUCTION SURGERY Bilateral 02/23/2018   Procedure: MAMMARY REDUCTION  (BREAST);  Surgeon: Contogiannis, Chales Abrahams, MD;  Location: [facility];  Service: Plastics;  Laterality: Bilateral;  . ELBOW SURGERY  2012     OB History   No obstetric history on file.      Home Medications    Prior to Admission medications   Medication Sig Start Date End Date Taking? Authorizing Provider   Pseudoeph-Doxylamine-DM-APAP (DAYQUIL/NYQUIL COLD/FLU RELIEF PO) Take 30 mLs by mouth every 4 (four) hours as needed (cold/flu symptoms).   Yes [provider]  benzonatate (TESSALON) 100 MG capsule Take 2 capsules (200 mg total) by mouth 3 (three) times daily as needed. 07/05/18   Burgess Amor, PA-C  chlorpheniramine-HYDROcodone (TUSSIONEX PENNKINETIC ER) 10-8 MG/5ML SUER Take 5 mLs by mouth 2 (two) times daily. 07/05/18   Burgess Amor, PA-C    Family History Family History  Problem Relation Age of Onset  . Cancer Mother     Social History Social History   Tobacco Use  . Smoking status: Never Smoker  . Smokeless tobacco: Never Used  Substance Use Topics  . Alcohol use: Yes    Frequency: Never    Comment: occas  . Drug use: Never     Allergies   Nickel   Review of Systems Review of Systems  Constitutional: Positive for fever. Negative for chills.  HENT: Positive for congestion, postnasal drip, rhinorrhea and sore throat. Negative for ear pain, sinus pressure, sinus pain, trouble swallowing and voice change.   Eyes: Negative for discharge.  Respiratory: Positive for cough and choking. Negative for shortness of breath, wheezing and stridor.   Cardiovascular: Negative for chest pain.  Gastrointestinal: Negative for abdominal pain and vomiting.  Genitourinary: Negative.      Physical Exam Updated Vital Signs BP (!) 161/106 (BP Location: Left Arm)   Pulse 96   Temp 98.9 F (37.2 C) (Oral)   Ht  5\' 3"  (1.6 m)   Wt 105.2 kg   LMP 06/21/2018   SpO2 98%   BMI 41.10 kg/m   Physical Exam Vitals signs and nursing note reviewed.  Constitutional:      Appearance: She is well-developed.  HENT:     Head: Normocephalic and atraumatic.     Right Ear: Tympanic membrane and ear canal normal.     Left Ear: Tympanic membrane and ear canal normal.     Nose: Mucosal edema and rhinorrhea present.     Mouth/Throat:     Pharynx: Uvula midline. No oropharyngeal exudate or  posterior oropharyngeal erythema.     Tonsils: No tonsillar abscesses.  Eyes:     Conjunctiva/sclera: Conjunctivae normal.  Neck:     Musculoskeletal: Normal range of motion.     Comments: Slight hoarseness to voice. Cardiovascular:     Rate and Rhythm: Normal rate.     Heart sounds: Normal heart sounds.  Pulmonary:     Effort: Pulmonary effort is normal. No respiratory distress.     Breath sounds: No stridor. No wheezing, rhonchi or rales.     Comments: No rhonchi or wheezing.  Frequent dry cough. Abdominal:     Palpations: Abdomen is soft.     Tenderness: There is no abdominal tenderness.  Musculoskeletal: Normal range of motion.  Skin:    General: Skin is warm and dry.     Findings: No rash.  Neurological:     Mental Status: She is alert and oriented to person, place, and time.      ED Treatments / Results  Labs (all labs ordered are listed, but only abnormal results are displayed) Labs Reviewed  POC URINE PREG, ED   POC urine test negative.  EKG None  Radiology Dg Chest 2 View  Result Date: 07/05/2018 CLINICAL DATA:  Productive cough for 6 days EXAM: CHEST - 2 VIEW COMPARISON:  None. FINDINGS: Normal heart size. Lungs clear. No pneumothorax. No pleural effusion. IMPRESSION: No active cardiopulmonary disease. Electronically Signed   By: Jolaine ClickArthur  Hoss M.D.   On: 07/05/2018 18:41    Procedures Procedures (including critical care time)  Medications Ordered in ED Medications  benzonatate (TESSALON) capsule 200 mg (200 mg Oral Given 07/05/18 1738)     Initial Impression / Assessment and Plan / ED Course  I have reviewed the triage vital signs and the nursing notes.  Pertinent labs & imaging results that were available during my care of the patient were reviewed by me and considered in my medical decision making (see chart for details).        Chest x-ray reviewed and discussed with patient.  She was given Tessalon here with some improvement in frequency of  cough.  She was prescribed additional Tessalon, Tussionex for nightly use PRN.  Caution regarding sedation.  Advised to continue other home medications for symptom relief.  PRN follow-up anticipated.  Patient with exam and history suggesting viral process.  She has no respiratory distress, her initial blood pressure was elevated, repeat blood pressure better improved but still elevated.  She was given referrals for establishing pcp, advised needs close f/u for repeat bp.  Final Clinical Impressions(s) / ED Diagnoses   Final diagnoses:  Acute nasopharyngitis  Elevated blood pressure reading    ED Discharge Orders         Ordered    benzonatate (TESSALON) 100 MG capsule  3 times daily PRN     07/05/18 1906    chlorpheniramine-HYDROcodone (TUSSIONEX PENNKINETIC  ER) 10-8 MG/5ML SUER  2 times daily     07/05/18 1906           Victoriano Lain 07/05/18 1924    Donnetta Hutching, MD 07/05/18 [phone]

## 2018-10-25 ENCOUNTER — Other Ambulatory Visit: Payer: Self-pay

## 2018-10-25 ENCOUNTER — Encounter (HOSPITAL_COMMUNITY): Payer: Self-pay | Admitting: Emergency Medicine

## 2018-10-25 ENCOUNTER — Emergency Department (HOSPITAL_COMMUNITY)
Admission: EM | Admit: 2018-10-25 | Discharge: 2018-10-26 | Disposition: A | Discharge status: Home / Self Care | Payer: 59 | Attending: Emergency Medicine | Admitting: Emergency Medicine

## 2018-10-25 DIAGNOSIS — R030 Elevated blood-pressure reading, without diagnosis of hypertension: Secondary | ICD-10-CM | POA: Diagnosis not present

## 2018-10-25 DIAGNOSIS — R200 Anesthesia of skin: Secondary | ICD-10-CM | POA: Diagnosis not present

## 2018-10-25 DIAGNOSIS — M7989 Other specified soft tissue disorders: Secondary | ICD-10-CM | POA: Insufficient documentation

## 2018-10-25 DIAGNOSIS — M79641 Pain in right hand: Secondary | ICD-10-CM | POA: Diagnosis present

## 2018-10-25 NOTE — ED Provider Notes (Addendum)
[name] Provider Note   CSN: [id number] Arrival date & time: 10/25/18  2108    History   Chief Complaint Chief Complaint  Patient presents with  . Hand Pain    HPI Leslie West is a 38 y.o. female.   The history is provided by the patient.  She noted onset today of swelling of her right thumb and right hand and some numbness in the thumb and index finger of the right hand.  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  She denies any trauma.  She noted that she was unable to hold a cup in her hand.  She denies any pain.  She has not done anything to treat her symptoms.  Past Medical History:  Diagnosis Date  . Medical history non-contributory     There are no active problems to display for this patient.   Past Surgical History:  Procedure Laterality Date  . BREAST REDUCTION SURGERY Bilateral 02/23/2018   Procedure: MAMMARY REDUCTION  (BREAST);  Surgeon: Contogiannis, Chales AbrahamsMary Ann, MD;  Location: [facility];  Service: Plastics;  Laterality: Bilateral;  . ELBOW SURGERY  2012     OB History   No obstetric history on file.      Home Medications    Prior to Admission medications   Medication Sig Start Date End Date Taking? Authorizing Provider  benzonatate (TESSALON) 100 MG capsule Take 2 capsules (200 mg total) by mouth 3 (three) times daily as needed. 07/05/18   Burgess AmorIdol, Julie, PA-C  chlorpheniramine-HYDROcodone (TUSSIONEX PENNKINETIC ER) 10-8 MG/5ML SUER Take 5 mLs by mouth 2 (two) times daily. 07/05/18   Burgess AmorIdol, Julie, PA-C  Pseudoeph-Doxylamine-DM-APAP (DAYQUIL/NYQUIL COLD/FLU RELIEF PO) Take 30 mLs by mouth every 4 (four) hours as needed (cold/flu symptoms).    [provider]    Family History Family History  Problem Relation Age of Onset  . Cancer Mother     Social History Social History   Tobacco Use  . Smoking status: Never Smoker  . Smokeless tobacco: Never Used   Substance Use Topics  . Alcohol use: Yes    Frequency: Never    Comment: occas  . Drug use: Never     Allergies   Nickel   Review of Systems Review of Systems  All other systems reviewed and are negative.    Physical Exam Updated Vital Signs BP (!) 158/113 (BP Location: Right Arm)   Pulse (!) 102   Temp 98.9 F (37.2 C) (Oral)   Resp 20   Ht 5\' 3"  (1.6 m)   Wt 102.1 kg   LMP 09/29/2018   SpO2 100%   BMI 39.86 kg/m   Physical Exam Vitals signs and nursing note reviewed.    38 year old female, resting comfortably and in no acute distress. Vital signs are significant for elevated blood pressure and borderline elevated heart rate. Oxygen saturation is 100%, which is normal. Head is normocephalic and atraumatic. PERRLA, EOMI. Oropharynx is clear. Neck is nontender and supple without adenopathy or JVD. Back is nontender and there is no CVA tenderness. Lungs are clear without rales, wheezes, or rhonchi. Chest is nontender. Heart has regular rate and rhythm without murmur. Abdomen is soft, flat, nontender without masses or hepatosplenomegaly and peristalsis is normoactive. Extremities: There is mild swelling of the thenar eminence of the right hand as well as the right thumb and index finger.  She holds her thumb, index finger, long  finger and a partly flexed position and can either fully extend or fully flex them.  There is normal sensation in the right hand.  There is full passive range of motion.  There is no erythema or warmth or tenderness. Skin is warm and dry without rash. Neurologic: Mental status is normal, cranial nerves are intact, there are no motor or sensory deficits.  ED Treatments / Results   Procedures Procedures  Medications Ordered in ED Medications  predniSONE (DELTASONE) tablet 60 mg (has no administration in time range)     Initial Impression / Assessment and Plan / ED Course  I have reviewed the triage vital signs and the nursing notes.   Right hand swelling of uncertain cause.  No signs of infection present, no signs of trauma.  No indication for imaging.  She is given a dose of prednisone and sent home with prescription for same.  Also, blood pressure is noted to be elevated, advised to have that rechecked as an outpatient.  On review of past records, blood pressure was also elevated at an ED visit in March  Final Clinical Impressions(s) / ED Diagnoses   Final diagnoses:  Swelling of right hand  Elevated blood pressure reading without diagnosis of hypertension    ED Discharge Orders         Ordered    predniSONE (DELTASONE) 50 MG tablet  Daily     10/26/18 8101           Delora Fuel, MD 75/10/25 8527    Delora Fuel, MD 78/24/23 0021

## 2018-10-25 NOTE — ED Triage Notes (Signed)
Pt c/o right thumb and index finger that started today.

## 2018-10-26 MED ORDER — PREDNISONE 50 MG PO TABS: 50.0000 mg | ORAL_TABLET | Freq: Every day | ORAL | 0 refills | Status: AC

## 2018-10-26 MED ORDER — PREDNISONE 50 MG PO TABS
60.0000 mg | ORAL_TABLET | Freq: Once | ORAL | Status: AC
  Administered 2018-10-26: 60 mg via ORAL
  Filled 2018-10-26: qty 1

## 2018-10-26 NOTE — Discharge Instructions (Addendum)
Return if signs of infection appear.  Your blood pressure was elevated today.  Please recheck it several times over the next week.  If it is staying consistently elevated, you will need to be started on medication to control it.

## 2019-01-14 ENCOUNTER — Other Ambulatory Visit: Payer: Self-pay | Admitting: Internal Medicine

## 2019-01-14 DIAGNOSIS — Z1231 Encounter for screening mammogram for malignant neoplasm of breast: Secondary | ICD-10-CM

## 2019-03-03 ENCOUNTER — Ambulatory Visit: Payer: 59

## 2020-05-10 ENCOUNTER — Other Ambulatory Visit: Payer: Self-pay

## 2020-05-10 ENCOUNTER — Emergency Department (HOSPITAL_COMMUNITY): Payer: Self-pay

## 2020-05-10 ENCOUNTER — Encounter (HOSPITAL_COMMUNITY): Payer: Self-pay | Admitting: Emergency Medicine

## 2020-05-10 ENCOUNTER — Emergency Department (HOSPITAL_COMMUNITY)
Admission: EM | Admit: 2020-05-10 | Discharge: 2020-05-10 | Disposition: A | Discharge status: Home / Self Care | Payer: Self-pay | Attending: Emergency Medicine | Admitting: Emergency Medicine

## 2020-05-10 DIAGNOSIS — E1165 Type 2 diabetes mellitus with hyperglycemia: Secondary | ICD-10-CM | POA: Insufficient documentation

## 2020-05-10 DIAGNOSIS — E119 Type 2 diabetes mellitus without complications: Secondary | ICD-10-CM

## 2020-05-10 DIAGNOSIS — R000 Tachycardia, unspecified: Secondary | ICD-10-CM | POA: Insufficient documentation

## 2020-05-10 LAB — BLOOD GAS, VENOUS
Acid-Base Excess: 3.7 mmol/L — ABNORMAL HIGH (ref 0.0–2.0)
Bicarbonate: 27.5 mmol/L (ref 20.0–28.0)
FIO2: 21
O2 Saturation: 96.1 %
Patient temperature: 37
pCO2, Ven: 41.4 mmHg — ABNORMAL LOW (ref 44.0–60.0)
pH, Ven: 7.439 — ABNORMAL HIGH (ref 7.250–7.430)
pO2, Ven: 82.3 mmHg — ABNORMAL HIGH (ref 32.0–45.0)

## 2020-05-10 LAB — POC URINE PREG, ED: Preg Test, Ur: NEGATIVE

## 2020-05-10 LAB — CBC
HCT: 40.2 % (ref 36.0–46.0)
Hemoglobin: 12.8 g/dL (ref 12.0–15.0)
MCH: 27.2 pg (ref 26.0–34.0)
MCHC: 31.8 g/dL (ref 30.0–36.0)
MCV: 85.5 fL (ref 80.0–100.0)
Platelets: 368 10*3/uL (ref 150–400)
RBC: 4.7 MIL/uL (ref 3.87–5.11)
RDW: 12.1 % (ref 11.5–15.5)
WBC: 5.8 10*3/uL (ref 4.0–10.5)
nRBC: 0 % (ref 0.0–0.2)

## 2020-05-10 LAB — BASIC METABOLIC PANEL
Anion gap: 15 (ref 5–15)
BUN: 10 mg/dL (ref 6–20)
CO2: 24 mmol/L (ref 22–32)
Calcium: 9.5 mg/dL (ref 8.9–10.3)
Chloride: 90 mmol/L — ABNORMAL LOW (ref 98–111)
Creatinine, Ser: 0.7 mg/dL (ref 0.44–1.00)
GFR, Estimated: >60 mL/min (ref >60)
Glucose, Bld: 518 mg/dL (ref 70–99)
Potassium: 4.1 mmol/L (ref 3.5–5.1)
Sodium: 129 mmol/L — ABNORMAL LOW (ref 135–145)

## 2020-05-10 LAB — URINALYSIS, ROUTINE W REFLEX MICROSCOPIC
Bilirubin Urine: NEGATIVE
Glucose, UA: >=500 mg/dL — AB
Hgb urine dipstick: NEGATIVE
Ketones, ur: 20 mg/dL — AB
Nitrite: NEGATIVE
Protein, ur: NEGATIVE mg/dL
Specific Gravity, Urine: 1.031 — ABNORMAL HIGH (ref 1.005–1.030)
pH: 6 (ref 5.0–8.0)

## 2020-05-10 LAB — CBG MONITORING, ED
Glucose-Capillary: 545 mg/dL (ref 70–99)
Glucose-Capillary: 400 mg/dL — ABNORMAL HIGH (ref 70–99)
Glucose-Capillary: 314 mg/dL — ABNORMAL HIGH (ref 70–99)
Glucose-Capillary: 244 mg/dL — ABNORMAL HIGH (ref 70–99)
Glucose-Capillary: 254 mg/dL — ABNORMAL HIGH (ref 70–99)

## 2020-05-10 LAB — BETA-HYDROXYBUTYRIC ACID: Beta-Hydroxybutyric Acid: 1.58 mmol/L — ABNORMAL HIGH (ref 0.05–0.27)

## 2020-05-10 MED ORDER — INSULIN REGULAR(HUMAN) IN NACL 100-0.9 UT/100ML-% IV SOLN
INTRAVENOUS | Status: DC
  Administered 2020-05-10: 19 [IU]/h via INTRAVENOUS
  Filled 2020-05-10: qty 100

## 2020-05-10 MED ORDER — DEXTROSE IN LACTATED RINGERS 5 % IV SOLN: INTRAVENOUS | Status: DC

## 2020-05-10 MED ORDER — BLOOD GLUCOSE MONITOR KIT: PACK | 0 refills | Status: AC

## 2020-05-10 MED ORDER — METFORMIN HCL 500 MG PO TABS: 500.0000 mg | ORAL_TABLET | Freq: Two times a day (BID) | ORAL | 0 refills | Status: AC

## 2020-05-10 MED ORDER — DEXTROSE 50 % IV SOLN: 0.0000 mL | INTRAVENOUS | Status: DC | PRN

## 2020-05-10 MED ORDER — SODIUM CHLORIDE 0.9 % IV BOLUS
1000.0000 mL | Freq: Once | INTRAVENOUS | Status: AC
  Administered 2020-05-10: 1000 mL via INTRAVENOUS

## 2020-05-10 MED ORDER — LACTATED RINGERS IV SOLN: INTRAVENOUS | Status: DC

## 2020-05-10 NOTE — ED Notes (Signed)
Patient transported to X-ray 

## 2020-05-10 NOTE — ED Notes (Signed)
Date and time results received: 05/10/20 11:31 AM    Test: CBG Critical Value: 518  Name of Provider Notified: MD notified  Orders Received? Or Actions Taken?: MD notified

## 2020-05-10 NOTE — ED Triage Notes (Signed)
Pt c/o blurred vision this am. Reports recent frequent urination and "feeling dehydrated", fatigue as well. Reports blood glucose was over 440 pta. Pt is not dx with diabetes.

## 2020-05-10 NOTE — ED Provider Notes (Signed)
[facility] EMERGENCY DEPARTMENT Provider Note   CSN: [id number] Arrival date & time: 05/10/20  4098     History Chief Complaint  Patient presents with  . Hyperglycemia    Leslie West is a 40 y.o. female.  Pt presents to the ED today with blurred vision.  She has noted that she's been drinking a lot and urinating a lot.  She checked her bs and it was 440.  She has no hx of dm.  No f/c.  No sob.  No n/v.        Past Medical History:  Diagnosis Date  . Medical history non-contributory     There are no problems to display for this patient.   Past Surgical History:  Procedure Laterality Date  . BREAST REDUCTION SURGERY Bilateral 02/23/2018   Procedure: MAMMARY REDUCTION  (BREAST);  Surgeon: Contogiannis, Audrea Muscat, MD;  Location: [facility];  Service: Plastics;  Laterality: Bilateral;  . ELBOW SURGERY  2012     OB History   No obstetric history on file.     Family History  Problem Relation Age of Onset  . Cancer Mother     Social History   Tobacco Use  . Smoking status: Never Smoker  . Smokeless tobacco: Never Used  Vaping Use  . Vaping Use: Never used  Substance Use Topics  . Alcohol use: Yes    Comment: occas  . Drug use: Never    Home Medications Prior to Admission medications   Medication Sig Start Date End Date Taking? Authorizing Provider  blood glucose meter kit and supplies KIT Dispense based on patient and insurance preference. Use up to four times daily as directed. (FOR ICD-9 250.00, 250.01). 05/10/20  Yes Isla Pence, MD  metFORMIN (GLUCOPHAGE) 500 MG tablet Take 1 tablet (500 mg total) by mouth 2 (two) times daily with a meal. 05/10/20  Yes Isla Pence, MD  predniSONE (DELTASONE) 50 MG tablet Take 1 tablet (50 mg total) by mouth daily. Patient not taking: No sig reported 04/21/89   Delora Fuel, MD    Allergies    Nickel  Review of Systems   Review of Systems  Endocrine: Positive for polydipsia and polyuria.   All other systems reviewed and are negative.   Physical Exam Updated Vital Signs BP 112/78   Pulse (!) 105   Temp 98.5 F (36.9 C)   Resp 19   Ht $R'5\' 3"'TW$  (1.6 m)   Wt 92.1 kg   LMP 05/01/2020   SpO2 99%   BMI 35.96 kg/m   Physical Exam Vitals and nursing note reviewed.  Constitutional:      Appearance: Normal appearance.  HENT:     Head: Normocephalic and atraumatic.     Right Ear: External ear normal.     Left Ear: External ear normal.     Nose: Nose normal.     Mouth/Throat:     Mouth: Mucous membranes are dry.  Eyes:     Extraocular Movements: Extraocular movements intact.     Conjunctiva/sclera: Conjunctivae normal.     Pupils: Pupils are equal, round, and reactive to light.  Cardiovascular:     Rate and Rhythm: Regular rhythm. Tachycardia present.     Pulses: Normal pulses.     Heart sounds: Normal heart sounds.  Pulmonary:     Effort: Pulmonary effort is normal.     Breath sounds: Normal breath sounds.  Abdominal:     General: Abdomen is flat. Bowel sounds are normal.  Palpations: Abdomen is soft.  Musculoskeletal:        General: Normal range of motion.     Cervical back: Normal range of motion and neck supple.  Skin:    General: Skin is warm.     Capillary Refill: Capillary refill takes less than 2 seconds.  Neurological:     General: No focal deficit present.     Mental Status: She is alert and oriented to person, place, and time.  Psychiatric:        Mood and Affect: Mood normal.        Behavior: Behavior normal.        Thought Content: Thought content normal.        Judgment: Judgment normal.     ED Results / Procedures / Treatments   Labs (all labs ordered are listed, but only abnormal results are displayed) Labs Reviewed  BASIC METABOLIC PANEL - Abnormal; Notable for the following components:      Result Value   Sodium 129 (*)    Chloride 90 (*)    Glucose, Bld 518 (*)    All other components within normal limits  URINALYSIS,  ROUTINE W REFLEX MICROSCOPIC - Abnormal; Notable for the following components:   APPearance CLOUDY (*)    Specific Gravity, Urine 1.031 (*)    Glucose, UA >=500 (*)    Ketones, ur 20 (*)    Leukocytes,Ua SMALL (*)    Bacteria, UA RARE (*)    All other components within normal limits  BLOOD GAS, VENOUS - Abnormal; Notable for the following components:   pH, Ven 7.439 (*)    pCO2, Ven 41.4 (*)    pO2, Ven 82.3 (*)    Acid-Base Excess 3.7 (*)    All other components within normal limits  BETA-HYDROXYBUTYRIC ACID - Abnormal; Notable for the following components:   Beta-Hydroxybutyric Acid 1.58 (*)    All other components within normal limits  CBG MONITORING, ED - Abnormal; Notable for the following components:   Glucose-Capillary 545 (*)    All other components within normal limits  CBG MONITORING, ED - Abnormal; Notable for the following components:   Glucose-Capillary 400 (*)    All other components within normal limits  CBG MONITORING, ED - Abnormal; Notable for the following components:   Glucose-Capillary 314 (*)    All other components within normal limits  CBG MONITORING, ED - Abnormal; Notable for the following components:   Glucose-Capillary 244 (*)    All other components within normal limits  CBC  POC URINE PREG, ED    EKG EKG Interpretation  Date/Time:  Thursday May 10 2020 10:22:33 EST Ventricular Rate:  126 PR Interval:    QRS Duration: 94 QT Interval:  305 QTC Calculation: 442 R Axis:   68 Text Interpretation: Sinus tachycardia Borderline T abnormalities, inferior leads No old tracing to compare Confirmed by Isla Pence [phone]) on 05/10/2020 10:42:41 AM   Radiology DG Chest 2 View  Result Date: 05/10/2020 CLINICAL DATA:  Hyperglycemia and dizziness EXAM: CHEST - 2 VIEW COMPARISON:  July 05, 2018 FINDINGS: Lungs are clear. Heart size and pulmonary vascularity are normal. No adenopathy. No bone lesions. IMPRESSION: Lungs clear.  Cardiac silhouette  normal. Electronically Signed   By: Lowella Grip III M.D.   On: 05/10/2020 12:00    Procedures Procedures (including critical care time)  Medications Ordered in ED Medications  insulin regular, human (MYXREDLIN) 100 units/ 100 mL infusion ( Intravenous Stopped 05/10/20 1428)  lactated ringers  infusion ( Intravenous New Bag/Given 05/10/20 1436)  dextrose 5 % in lactated ringers infusion ( Intravenous Not Given 05/10/20 1446)  dextrose 50 % solution 0-50 mL (has no administration in time range)  sodium chloride 0.9 % bolus 1,000 mL (0 mLs Intravenous Stopped 05/10/20 1418)    ED Course  I have reviewed the triage vital signs and the nursing notes.  Pertinent labs & imaging results that were available during my care of the patient were reviewed by me and considered in my medical decision making (see chart for details).    MDM Rules/Calculators/A&P                          Pt is a new onset diabetic.  Pt's bs is initially 518, but is now down to 244 with an insulin drip and IVFs.  Pt is not in DKA.  She likely has DM2.  Pt does not have insurance, so SW was consulted to see if we can help with pt's meds and glucometer.   CRITICAL CARE Performed by: Isla Pence   Total critical care time: 30 minutes  Critical care time was exclusive of separately billable procedures and treating other patients.  Critical care was necessary to treat or prevent imminent or life-threatening deterioration.  Critical care was time spent personally by me on the following activities: development of treatment plan with patient and/or surrogate as well as nursing, discussions with consultants, evaluation of patient's response to treatment, examination of patient, obtaining history from patient or surrogate, ordering and performing treatments and interventions, ordering and review of laboratory studies, ordering and review of radiographic studies, pulse oximetry and re-evaluation of patient's  condition. Final Clinical Impression(s) / ED Diagnoses Final diagnoses:  New onset type 2 diabetes mellitus ([place])    Rx / DC Orders ED Discharge Orders         Ordered    metFORMIN (GLUCOPHAGE) 500 MG tablet  2 times daily with meals        05/10/20 1522    blood glucose meter kit and supplies KIT        05/10/20 1522           Isla Pence, MD 05/10/20 1523

## 2020-05-10 NOTE — Clinical Social Work Note (Signed)
Transition of Care [facility]) - Emergency Department Mini Assessment  Patient Details  Name: Leslie West MRN: [id number] Date of Birth: 1980/08/02  [facility]) CM/SW Contact:    Ewing Schlein, LCSW Phone Number: 05/10/2020, 3:29 PM  Clinical Narrative: Patient is a 40 year old female who presented to the ED for high sugar. TOC received consult for medication assistance due to new diabetes diagnosis. Patient will get insurance in February, but will need metformin and a glucometer before then. CSW spoke with [place] at the [facility] in [place]. The [facility] voucher does not cover a glucometer and the metformin is on the $4 list. CSW updated EDP. Patient to follow up OP with diabetic needs.  ED Mini Assessment: What brought you to the Emergency Department? : High sugar Barriers to Discharge: Inadequate or no insurance,ED Medication assistance Barrier interventions: [employer] voucher does not cover glucometer, metformin is on [employer]'s $4 list Means of departure: Car Interventions which prevented an admission or readmission: Medication Review  Patient Contact and Communications Key Contact 1: [facility] pharmacy Contact Date: 05/10/20     Call outcome: Glucometer will not be covered Choice offered to / list presented to : NA  Admission diagnosis:  high sugar There are no problems to display for this patient.  PCP:  Patient, No Pcp Per Pharmacy:   [facility] - [place], [place] - [address] [place] [address] Phone: [phone] Fax: [phone]

## 2021-09-01 ENCOUNTER — Emergency Department (HOSPITAL_COMMUNITY)
Admission: EM | Admit: 2021-09-01 | Discharge: 2021-09-02 | Disposition: A | Discharge status: Home / Self Care | Payer: Managed Care, Other (non HMO) | Attending: Emergency Medicine | Admitting: Emergency Medicine

## 2021-09-01 ENCOUNTER — Emergency Department (HOSPITAL_COMMUNITY): Payer: Managed Care, Other (non HMO)

## 2021-09-01 ENCOUNTER — Encounter (HOSPITAL_COMMUNITY): Payer: Self-pay | Admitting: Emergency Medicine

## 2021-09-01 DIAGNOSIS — Z20822 Contact with and (suspected) exposure to covid-19: Secondary | ICD-10-CM | POA: Diagnosis not present

## 2021-09-01 DIAGNOSIS — J019 Acute sinusitis, unspecified: Secondary | ICD-10-CM | POA: Insufficient documentation

## 2021-09-01 DIAGNOSIS — E119 Type 2 diabetes mellitus without complications: Secondary | ICD-10-CM | POA: Insufficient documentation

## 2021-09-01 DIAGNOSIS — R000 Tachycardia, unspecified: Secondary | ICD-10-CM | POA: Insufficient documentation

## 2021-09-01 DIAGNOSIS — Z7984 Long term (current) use of oral hypoglycemic drugs: Secondary | ICD-10-CM | POA: Diagnosis not present

## 2021-09-01 DIAGNOSIS — R0981 Nasal congestion: Secondary | ICD-10-CM | POA: Diagnosis present

## 2021-09-01 HISTORY — DX: Type 2 diabetes mellitus without complications: E11.9

## 2021-09-01 MED ORDER — ACETAMINOPHEN 325 MG PO TABS
650.0000 mg | ORAL_TABLET | Freq: Once | ORAL | Status: AC
  Administered 2021-09-01: 650 mg via ORAL
  Filled 2021-09-01: qty 2

## 2021-09-01 NOTE — ED Triage Notes (Signed)
Pt reports nasal congestion x 3 weeks. Denies sore throat or headache. Does endorses a nagging dry cough. A&Ox4. Ambulatory,  ?

## 2021-09-02 LAB — RESP PANEL BY RT-PCR (FLU A&B, COVID) ARPGX2
Influenza A by PCR: NEGATIVE
Influenza B by PCR: NEGATIVE
SARS Coronavirus 2 by RT PCR: NEGATIVE

## 2021-09-02 MED ORDER — AMOXICILLIN-POT CLAVULANATE 875-125 MG PO TABS: 1.0000 | ORAL_TABLET | Freq: Two times a day (BID) | ORAL | 0 refills | Status: DC

## 2021-09-02 MED ORDER — FLUTICASONE PROPIONATE 50 MCG/ACT NA SUSP: 1.0000 | Freq: Every day | NASAL | 0 refills | Status: AC

## 2021-09-02 NOTE — Discharge Instructions (Addendum)
You were seen in the emergency department today for nasal congestion with sinus pressure.  Your chest x-ray did not show pneumonia.  We tested you for COVID and flu - these results were negative.  Given your duration of symptoms we are covering you for bacterial sinusitis with Augmentin, please take this as prescribed.  We are also sending you with a prescription for Flonase to use 1 spray per nostril daily as needed for congestion/sinus pressure ? ?We have prescribed you new medication(s) today. Discuss the medications prescribed today with your pharmacist as they can have adverse effects and interactions with your other medicines including over the counter and prescribed medications. Seek medical evaluation if you start to experience new or abnormal symptoms after taking one of these medicines, seek care immediately if you start to experience difficulty breathing, feeling of your throat closing, facial swelling, or rash as these could be indications of a more serious allergic reaction ? ?Please continue to take your antihistamine medication at home.  Follow-up with your primary care provider within 1 week.  Return to the ER for new or worsening symptoms including but not limited to new or worsening pain, confusion, neck stiffness, fever, passing out, trouble breathing, or any other concerns. ? ?Please have your blood pressure rechecked by your primary care provider as it was elevated in the ER today. ?

## 2021-09-02 NOTE — ED Provider Notes (Signed)
?[facility] DEPT ?Provider Note ? ? ?CSN: [id number] ?Arrival date & time: 09/01/21  2217 ? ?  ? ?History ? ?Chief Complaint  ?Patient presents with  ? Nasal Congestion  ? ? ?Leslie West is a 41 y.o. female with a history of diabetes mellitus who presents to the emergency department with complaints of nasal congestion and sinus pressure that has been progressively worsening over the past 2 to 3 weeks.  Patient reports congestion, rhinorrhea, intermittent sinus pain/pressure, especially in the mornings.  Having associated dry cough.  She has been taking daily antihistamines without improvement.  Denies fever at home, dyspnea, chest pain, abdominal pain, or vomiting. ? ?HPI ? ?  ? ?Home Medications ?Prior to Admission medications   ?Medication Sig Start Date End Date Taking? Authorizing Provider  ?blood glucose meter kit and supplies KIT Dispense based on patient and insurance preference. Use up to four times daily as directed. (FOR ICD-9 250.00, 250.01). 05/10/20   Isla Pence, MD  ?metFORMIN (GLUCOPHAGE) 500 MG tablet Take 1 tablet (500 mg total) by mouth 2 (two) times daily with a meal. 05/10/20   Isla Pence, MD  ?predniSONE (DELTASONE) 50 MG tablet Take 1 tablet (50 mg total) by mouth daily. ?Patient not taking: No sig reported 0/1/02   Delora Fuel, MD  ?   ? ?Allergies    ?Nickel   ? ?Review of Systems   ?Review of Systems  ?Constitutional:  Negative for fever.  ?HENT:  Positive for congestion, rhinorrhea, sinus pressure and sinus pain. Negative for ear pain.   ?Respiratory:  Positive for cough. Negative for shortness of breath.   ?Cardiovascular:  Negative for chest pain.  ?Gastrointestinal:  Negative for abdominal pain and vomiting.  ?Neurological:  Negative for syncope.  ?All other systems reviewed and are negative. ? ?Physical Exam ?Updated Vital Signs ?BP (!) 167/105 (BP Location: Left Arm)   Pulse (!) 114   Temp 99.9 ?F (37.7 ?C) (Oral)   Resp 16   Ht _0   (1.626 m)   Wt 96.6 kg   LMP 08/28/2021   SpO2 97%   BMI 36.56 kg/m?  ?Physical Exam ?Vitals and nursing note reviewed.  ?Constitutional:   ?   General: She is not in acute distress. ?   Appearance: She is well-developed.  ?HENT:  ?   Head: Normocephalic and atraumatic.  ?   Right Ear: Ear canal normal. Tympanic membrane is not perforated, erythematous, retracted or bulging.  ?   Left Ear: Ear canal normal. Tympanic membrane is not perforated, erythematous, retracted or bulging.  ?   Ears:  ?   Comments: No mastoid erythema/swelling/tenderness.  ?   Nose: Congestion present.  ?   Right Sinus: Maxillary sinus tenderness present. No frontal sinus tenderness.  ?   Left Sinus: Maxillary sinus tenderness present. No frontal sinus tenderness.  ?   Mouth/Throat:  ?   Pharynx: Uvula midline. No oropharyngeal exudate or posterior oropharyngeal erythema.  ?   Comments: Posterior oropharynx is symmetric appearing. Patient tolerating own secretions without difficulty. No trismus. No drooling. No hot potato voice. No swelling beneath the tongue, submandibular compartment is soft.  ?Eyes:  ?   General:     ?   Right eye: No discharge.     ?   Left eye: No discharge.  ?   Conjunctiva/sclera: Conjunctivae normal.  ?   Pupils: Pupils are equal, round, and reactive to light.  ?Cardiovascular:  ?   Rate and Rhythm: Regular  rhythm. Tachycardia present.  ?   Heart sounds: No murmur heard. ?Pulmonary:  ?   Effort: Pulmonary effort is normal. No respiratory distress.  ?   Breath sounds: Normal breath sounds. No wheezing, rhonchi or rales.  ?Abdominal:  ?   General: There is no distension.  ?   Palpations: Abdomen is soft.  ?   Tenderness: There is no abdominal tenderness.  ?Musculoskeletal:  ?   Cervical back: Normal range of motion and neck supple. No edema or rigidity.  ?Lymphadenopathy:  ?   Cervical: No cervical adenopathy.  ?Skin: ?   General: Skin is warm and dry.  ?   Findings: No rash.  ?Neurological:  ?   Mental Status: She  is alert.  ?Psychiatric:     ?   Behavior: Behavior normal.  ? ? ?ED Results / Procedures / Treatments   ?Labs ?(all labs ordered are listed, but only abnormal results are displayed) ?Labs Reviewed  ?RESP PANEL BY RT-PCR (FLU A&B, COVID) [id number]  ? ? ?EKG ?None ? ?Radiology ?DG Chest 2 View ? ?Result Date: 09/02/2021 ?CLINICAL DATA:  Cough and congestion for several weeks EXAM: CHEST - 2 VIEW COMPARISON:  05/10/20 FINDINGS: The heart size and mediastinal contours are within normal limits. Both lungs are clear. The visualized skeletal structures are unremarkable. IMPRESSION: No active cardiopulmonary disease. Electronically Signed   By: Inez Catalina M.D.   On: 09/02/2021 00:16   ? ?Procedures ?Procedures  ? ? ?Medications Ordered in ED ?Medications  ?acetaminophen (TYLENOL) tablet 650 mg (650 mg Oral Given 09/01/21 2346)  ? ? ?ED Course/ Medical Decision Making/ A&P ?  ?                        ?Medical Decision Making ?Amount and/or Complexity of Data Reviewed ?Radiology: ordered. ? ?Risk ?OTC drugs. ?Prescription drug management. ? ? ?Patient presents to the ED with complaints of 2-3 weeks of sinus pressure & congestion with dry cough. Nontoxic, vitals w/ elevated BP and mild tachycardia, oral temp noted to be 99.9.  ? ?Chart/nursing note reviewed.  ?Covid/flu testing ordered by me- negative  ?I ordered, viewed & interpreted CXR- agree with radiologist- no active cardiopulmonary disease.  ? ?Given sxs for 2-3 weeks now with progressive worsening despite appropriate allergy tx feel patient warrants coverage for bacterial sinusitis especially with her history of diabetes and her borderline temperature today. She has no findings of AMS, no meningismus. CXR w/o pneumonia. Will tx with augmentin & flonase, continue antihistamines, PCP follow up. HR improved to 103. I discussed results, treatment plan, need for follow-up, and return precautions with the patient. Provided opportunity for questions, patient confirmed  understanding and is in agreement with plan.  ? ? ? ? ? ? ? ? ?Final Clinical Impression(s) / ED Diagnoses ?Final diagnoses:  ?Acute non-recurrent sinusitis, unspecified location  ? ? ?Rx / DC Orders ?ED Discharge Orders   ? ?      Ordered  ?  amoxicillin-clavulanate (AUGMENTIN) 875-125 MG tablet  Every 12 hours       ? 09/02/21 0030  ?  fluticasone (FLONASE) 50 MCG/ACT nasal spray  Daily       ? 09/02/21 0030  ? ?  ?  ? ?  ? ? ?  ?Amaryllis Dyke, PA-C ?09/02/21 0106 ? ?  ?Maudie Flakes, MD ?09/02/21 [phone] ? ?

## 2022-01-10 ENCOUNTER — Ambulatory Visit: Payer: Managed Care, Other (non HMO) | Admitting: Nurse Practitioner

## 2022-01-24 ENCOUNTER — Ambulatory Visit: Payer: Managed Care, Other (non HMO) | Admitting: Nurse Practitioner

## 2022-01-26 ENCOUNTER — Other Ambulatory Visit: Payer: Self-pay

## 2022-01-26 ENCOUNTER — Encounter (HOSPITAL_BASED_OUTPATIENT_CLINIC_OR_DEPARTMENT_OTHER): Payer: Self-pay | Admitting: Emergency Medicine

## 2022-01-26 ENCOUNTER — Emergency Department (HOSPITAL_BASED_OUTPATIENT_CLINIC_OR_DEPARTMENT_OTHER)
Admission: EM | Admit: 2022-01-26 | Discharge: 2022-01-26 | Disposition: A | Discharge status: Home / Self Care | Payer: Managed Care, Other (non HMO) | Attending: Emergency Medicine | Admitting: Emergency Medicine

## 2022-01-26 DIAGNOSIS — H5711 Ocular pain, right eye: Secondary | ICD-10-CM | POA: Diagnosis present

## 2022-01-26 DIAGNOSIS — H00012 Hordeolum externum right lower eyelid: Secondary | ICD-10-CM | POA: Diagnosis not present

## 2022-01-26 DIAGNOSIS — Z7984 Long term (current) use of oral hypoglycemic drugs: Secondary | ICD-10-CM | POA: Insufficient documentation

## 2022-01-26 MED ORDER — AMOXICILLIN-POT CLAVULANATE 875-125 MG PO TABS: 1.0000 | ORAL_TABLET | Freq: Two times a day (BID) | ORAL | 0 refills | Status: AC

## 2022-01-26 NOTE — ED Provider Notes (Signed)
[facility] Provider Note   CSN: [id number] Arrival date & time: 01/26/22  1821     History  Chief Complaint  Patient presents with   Eye Problem    Leslie West is a 41 y.o. female.  Patient is a 41 year old female who presents with swelling and pain into her right eyelid.  She said it started 2 days ago with a little soreness and now its more of a knot.  She does not have any change in her vision.  No fevers.         Home Medications Prior to Admission medications   Medication Sig Start Date End Date Taking? Authorizing Provider  amoxicillin-clavulanate (AUGMENTIN) 875-125 MG tablet Take 1 tablet by mouth every 12 (twelve) hours. 01/26/22  Yes Malvin Johns, MD  blood glucose meter kit and supplies KIT Dispense based on patient and insurance preference. Use up to four times daily as directed. (FOR ICD-9 250.00, 250.01). 05/10/20   Isla Pence, MD  fluticasone [facility]) 50 MCG/ACT nasal spray Place 1 spray into both nostrils daily. 09/02/21   Petrucelli, Samantha R, PA-C  metFORMIN (GLUCOPHAGE) 500 MG tablet Take 1 tablet (500 mg total) by mouth 2 (two) times daily with a meal. 05/10/20   Isla Pence, MD  predniSONE (DELTASONE) 50 MG tablet Take 1 tablet (50 mg total) by mouth daily. Patient not taking: No sig reported 04/22/55   Delora Fuel, MD      Allergies    Nickel    Review of Systems   Review of Systems  Constitutional:  Negative for fever.  Eyes:  Positive for discharge (Some crusting in the corner of her eye) and redness. Negative for visual disturbance.  Gastrointestinal:  Negative for nausea and vomiting.  Neurological:  Negative for headaches.    Physical Exam Updated Vital Signs BP (!) 154/101 (BP Location: Right Arm)   Pulse 82   Temp 99.2 F (37.3 C) (Oral)   Resp 18   Ht $R'5\' 3"'AY$  (1.6 m)   Wt 96.6 kg   LMP 01/03/2022   SpO2 100%   BMI 37.73 kg/m  Physical Exam Constitutional:      Appearance: Normal  appearance.  Eyes:     Extraocular Movements: Extraocular movements intact.     Conjunctiva/sclera: Conjunctivae normal.     Pupils: Pupils are equal, round, and reactive to light.     Comments: Patient has what appears to be a hordeolum to the right lower lid.  There is some tenderness around the lid and some mild erythema around the lower lid.  There is no pain with eye movements.  No circumferential swelling or redness.  Cardiovascular:     Rate and Rhythm: Normal rate.  Pulmonary:     Effort: Pulmonary effort is normal.  Neurological:     Mental Status: She is alert and oriented to person, place, and time.     ED Results / Procedures / Treatments   Labs (all labs ordered are listed, but only abnormal results are displayed) Labs Reviewed - No data to display  EKG None  Radiology No results found.  Procedures Procedures    Medications Ordered in ED Medications - No data to display  ED Course/ Medical Decision Making/ A&P                           Medical Decision Making Risk Prescription drug management.   Patient presents with a worsening stye.  There  is some signs of mild surrounding infection.  No evidence of be more concerning for preseptal or septal cellulitis.  She was advised on warm compresses.  We will start Augmentin given her worsening symptoms.  Was given a referral to follow-up with ophthalmology if her symptoms are improving.  Return precautions were given.  Final Clinical Impression(s) / ED Diagnoses Final diagnoses:  Hordeolum externum of right lower eyelid    Rx / DC Orders ED Discharge Orders          Ordered    amoxicillin-clavulanate (AUGMENTIN) 875-125 MG tablet  Every 12 hours        01/26/22 2050              Malvin Johns, MD 01/26/22 2104

## 2022-01-26 NOTE — ED Triage Notes (Signed)
Pt with pain/redness/swelling to lower lif od RT eye; sxs began Thurs

## 2022-01-26 NOTE — ED Notes (Signed)
D/c paperwork reviewed with pt, including prescription. No questions or concerns at time of d/c. Pt ambulatory to ED exit without assistance, NAD.  

## 2022-02-11 ENCOUNTER — Ambulatory Visit: Payer: Managed Care, Other (non HMO) | Admitting: Emergency Medicine

## 2022-04-18 ENCOUNTER — Ambulatory Visit: Payer: Managed Care, Other (non HMO) | Admitting: Nurse Practitioner

## 2022-05-15 ENCOUNTER — Ambulatory Visit: Payer: Managed Care, Other (non HMO) | Admitting: Nurse Practitioner

## 2023-08-01 IMAGING — CR DG CHEST 2V
2 series · 2 of 2 positions shown · non-contrast
Comparison: 05/10/20

CLINICAL DATA: Cough and congestion for several weeks

EXAM:
CHEST - 2 VIEW

[w chest pa]
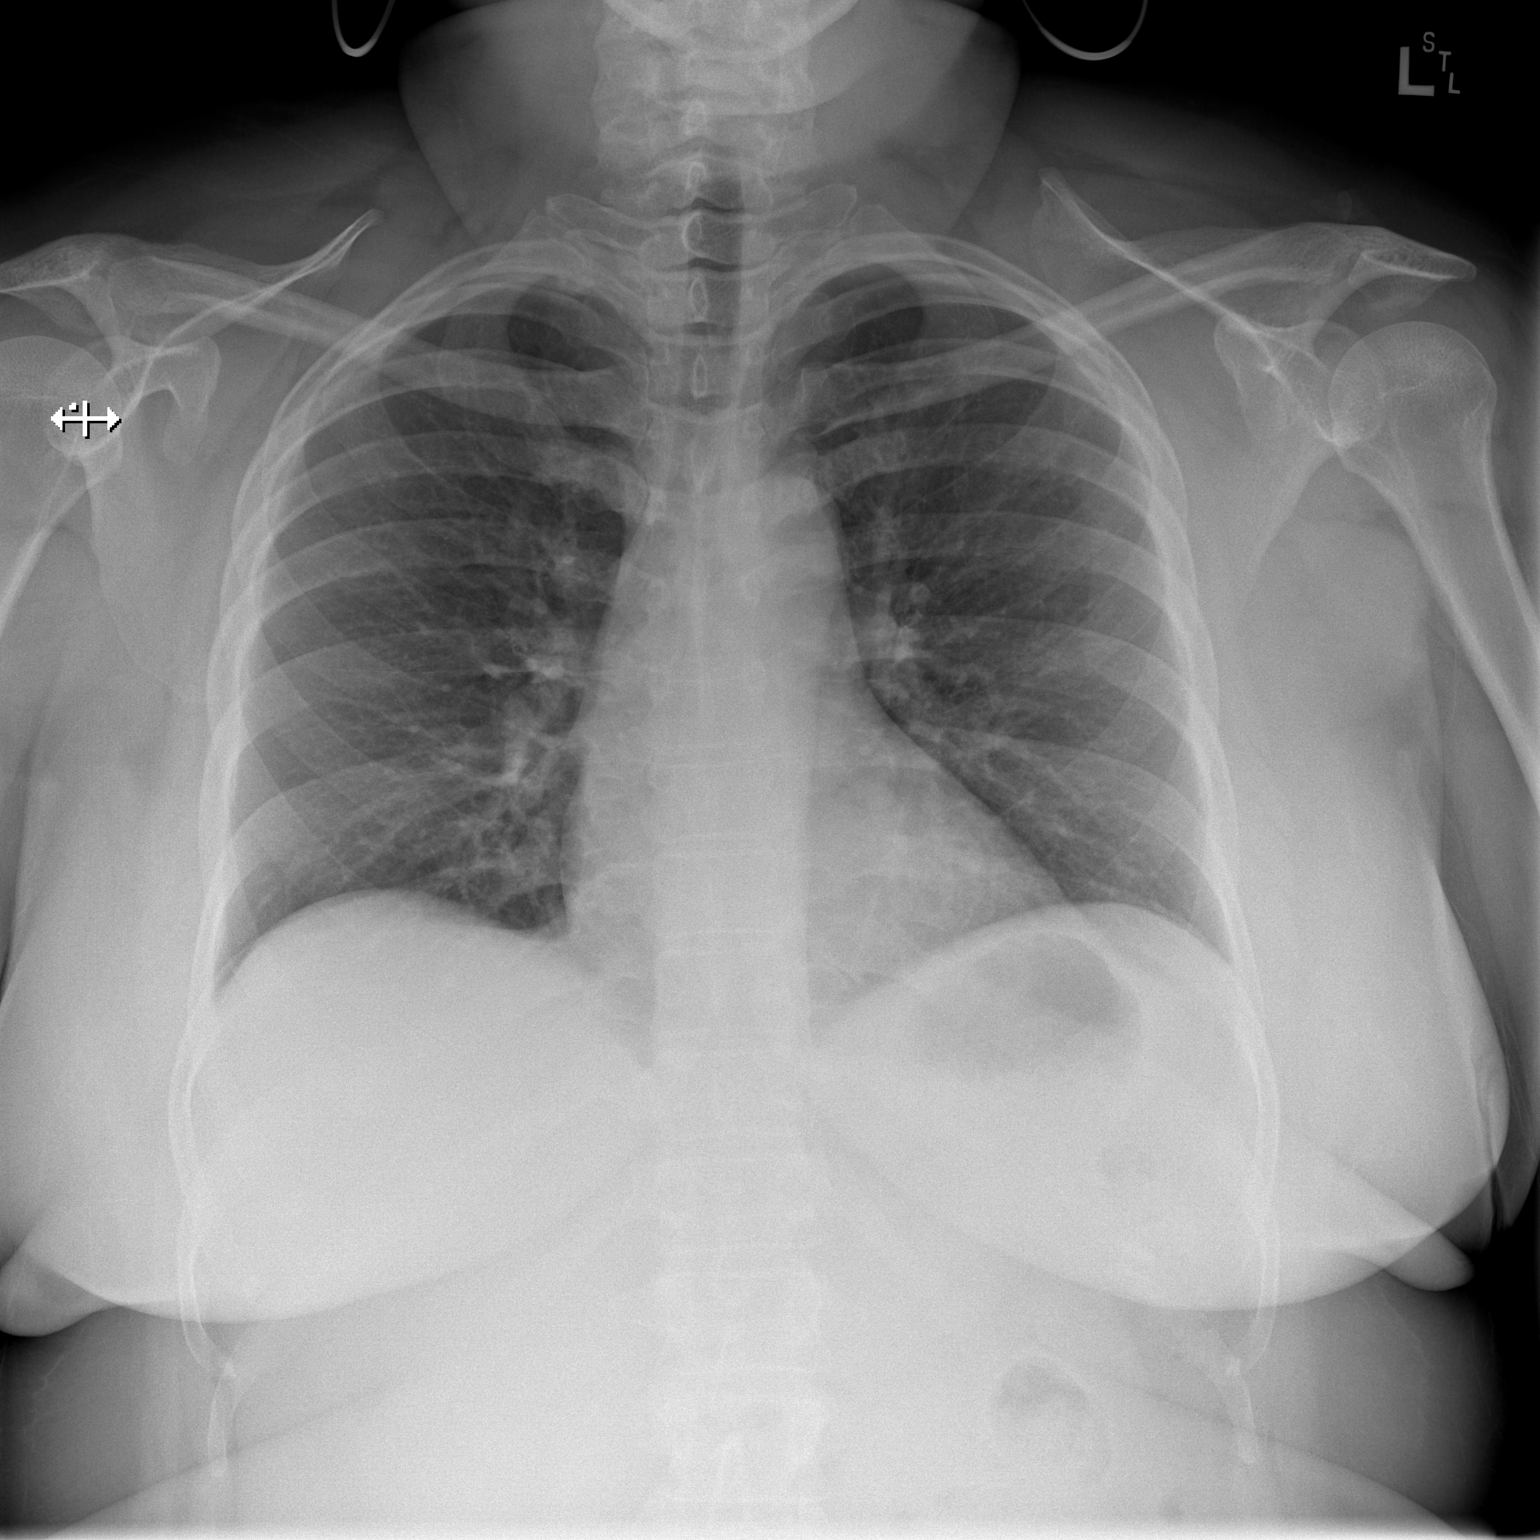

[w chest lat]
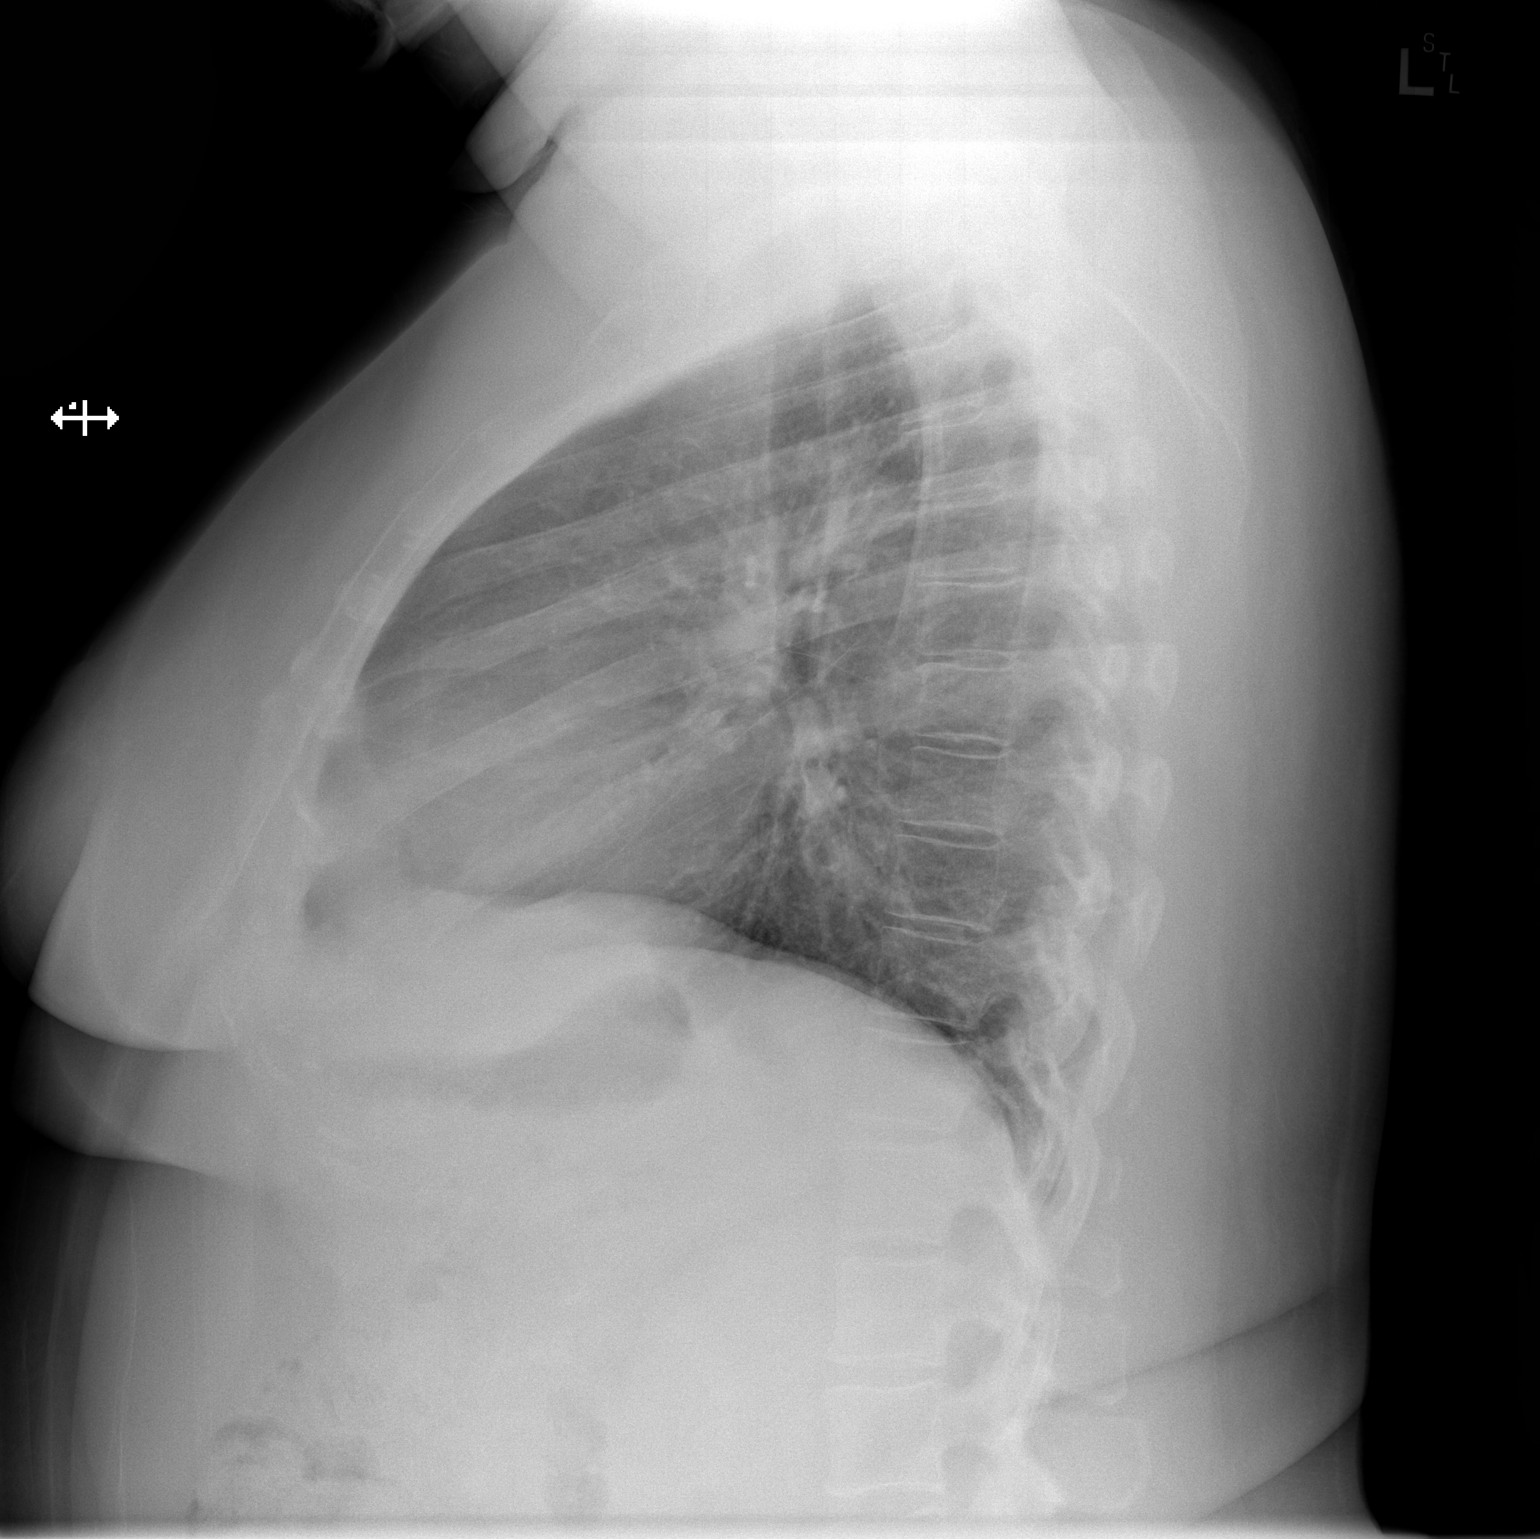

[2 of 2 positions shown; findings below may reference images not displayed]

FINDINGS: The heart size and mediastinal contours are within normal limits.
Both lungs are clear. The visualized skeletal structures are
unremarkable.
IMPRESSION: No active cardiopulmonary disease.
